# Patient Record
Sex: Female | Born: 2000 | Race: White | Hispanic: No | Marital: Single | Smoking: Never smoker
Health system: Southern US, Community
[De-identification: ages and names within clinical notes are randomized; demographics above are authoritative.]

## PROBLEM LIST (undated history)

## (undated) DIAGNOSIS — N390 Urinary tract infection, site not specified: Secondary | ICD-10-CM

## (undated) HISTORY — DX: Urinary tract infection, site not specified: N39.0

---
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
## 2021-01-14 ENCOUNTER — Other Ambulatory Visit (HOSPITAL_COMMUNITY): Payer: Self-pay | Admitting: Psychiatry

## 2021-01-22 ENCOUNTER — Other Ambulatory Visit (HOSPITAL_COMMUNITY): Payer: Self-pay | Admitting: Psychiatry

## 2021-01-27 ENCOUNTER — Other Ambulatory Visit (HOSPITAL_COMMUNITY): Payer: Self-pay | Admitting: Psychiatry

## 2021-01-28 ENCOUNTER — Telehealth (INDEPENDENT_AMBULATORY_CARE_PROVIDER_SITE_OTHER): Payer: BC Managed Care – PPO | Admitting: Psychiatry

## 2021-01-28 ENCOUNTER — Encounter (HOSPITAL_COMMUNITY): Payer: Self-pay | Admitting: Psychiatry

## 2021-01-28 DIAGNOSIS — F5102 Adjustment insomnia: Secondary | ICD-10-CM | POA: Diagnosis not present

## 2021-01-28 DIAGNOSIS — F410 Panic disorder [episodic paroxysmal anxiety] without agoraphobia: Secondary | ICD-10-CM

## 2021-01-28 DIAGNOSIS — F330 Major depressive disorder, recurrent, mild: Secondary | ICD-10-CM | POA: Diagnosis not present

## 2021-01-28 MED ORDER — SERTRALINE HCL 50 MG PO TABS: 75.0000 mg | ORAL_TABLET | Freq: Every day | ORAL | 1 refills | Status: DC

## 2021-01-28 MED ORDER — HYDROXYZINE PAMOATE 25 MG PO CAPS: 25.0000 mg | ORAL_CAPSULE | Freq: Every evening | ORAL | 1 refills | Status: DC | PRN
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
## 2021-02-14 ENCOUNTER — Telehealth (HOSPITAL_COMMUNITY): Payer: BC Managed Care – PPO | Admitting: Psychiatry

## 2021-02-28 ENCOUNTER — Telehealth (HOSPITAL_COMMUNITY): Payer: Self-pay | Admitting: Psychiatry

## 2021-02-28 ENCOUNTER — Other Ambulatory Visit: Payer: Self-pay | Admitting: Psychiatry

## 2021-02-28 MED ORDER — SERTRALINE HCL 50 MG PO TABS: 75.0000 mg | ORAL_TABLET | Freq: Every day | ORAL | 0 refills | Status: DC

## 2021-02-28 MED ORDER — TRAZODONE HCL 50 MG PO TABS: ORAL_TABLET | ORAL | 0 refills | Status: DC

## 2021-02-28 NOTE — Telephone Encounter (Signed)
Ordered sertraline and trazodone.

## 2021-02-28 NOTE — Telephone Encounter (Signed)
Pt called  -She is out of   sertraline (ZOLOFT) 50 MG tablet  - wants a 90 day supply - was told by the pharmacy that she needs new prescription to get 90 day  Pt also stated that hydrOXYzine (VISTARIL) 25 MG capsule - not working and wants to switch back to traZODone (DESYREL) 50 MG tablet   - I know this may have to wait until Dr. Gilmore Laroche is back in office but I wanted to relay the message

## 2021-03-29 ENCOUNTER — Telehealth (HOSPITAL_COMMUNITY): Payer: BC Managed Care – PPO | Admitting: Psychiatry

## 2021-04-05 ENCOUNTER — Encounter (HOSPITAL_COMMUNITY): Payer: Self-pay | Admitting: Psychiatry

## 2021-04-05 ENCOUNTER — Telehealth (INDEPENDENT_AMBULATORY_CARE_PROVIDER_SITE_OTHER): Payer: Self-pay | Admitting: Psychiatry

## 2021-04-05 DIAGNOSIS — F5102 Adjustment insomnia: Secondary | ICD-10-CM

## 2021-04-05 DIAGNOSIS — F411 Generalized anxiety disorder: Secondary | ICD-10-CM

## 2021-04-05 DIAGNOSIS — F410 Panic disorder [episodic paroxysmal anxiety] without agoraphobia: Secondary | ICD-10-CM

## 2021-04-05 DIAGNOSIS — F330 Major depressive disorder, recurrent, mild: Secondary | ICD-10-CM

## 2021-04-05 MED ORDER — TRAZODONE HCL 50 MG PO TABS: ORAL_TABLET | ORAL | 0 refills | Status: DC

## 2021-04-05 MED ORDER — SERTRALINE HCL 50 MG PO TABS: 75.0000 mg | ORAL_TABLET | Freq: Every day | ORAL | 0 refills | Status: DC
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
## 2021-05-02 ENCOUNTER — Other Ambulatory Visit (HOSPITAL_COMMUNITY): Payer: Self-pay

## 2021-05-02 MED ORDER — TRAZODONE HCL 50 MG PO TABS: ORAL_TABLET | ORAL | 0 refills | Status: DC

## 2021-06-10 ENCOUNTER — Telehealth (INDEPENDENT_AMBULATORY_CARE_PROVIDER_SITE_OTHER): Payer: Self-pay | Admitting: Psychiatry

## 2021-06-10 ENCOUNTER — Encounter (HOSPITAL_COMMUNITY): Payer: Self-pay | Admitting: Psychiatry

## 2021-06-10 DIAGNOSIS — F330 Major depressive disorder, recurrent, mild: Secondary | ICD-10-CM

## 2021-06-10 DIAGNOSIS — F5102 Adjustment insomnia: Secondary | ICD-10-CM

## 2021-06-10 DIAGNOSIS — F410 Panic disorder [episodic paroxysmal anxiety] without agoraphobia: Secondary | ICD-10-CM

## 2021-06-10 MED ORDER — SERTRALINE HCL 100 MG PO TABS: 100.0000 mg | ORAL_TABLET | Freq: Every day | ORAL | 0 refills | Status: DC

## 2021-06-10 MED ORDER — TRAZODONE HCL 50 MG PO TABS: ORAL_TABLET | ORAL | 0 refills | Status: DC
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
## 2021-07-23 ENCOUNTER — Telehealth (HOSPITAL_COMMUNITY): Payer: Self-pay | Admitting: Psychiatry

## 2021-07-29 ENCOUNTER — Other Ambulatory Visit (HOSPITAL_COMMUNITY): Payer: Self-pay | Admitting: Psychiatry

## 2021-07-29 ENCOUNTER — Telehealth (HOSPITAL_COMMUNITY): Payer: Self-pay

## 2021-07-29 MED ORDER — TRAZODONE HCL 50 MG PO TABS: ORAL_TABLET | ORAL | 0 refills | Status: DC

## 2021-07-29 MED ORDER — SERTRALINE HCL 100 MG PO TABS: 100.0000 mg | ORAL_TABLET | Freq: Every day | ORAL | 0 refills | Status: DC

## 2021-07-29 NOTE — Telephone Encounter (Signed)
New message    1. Which medications need to be refilled? (please list name of each medication and dose if known) sertraline (ZOLOFT) 100 MG tablet  2. Which pharmacy/location (including street and city if local pharmacy) is medication to be sent to?[facility] [phone] - [place], [address] AT [place]  3. Do they need a 30 day or 90 day supply? 90 days supply      1. Which medications need to be refilled? (please list name of each medication and dose if known) traZODone (DESYREL) 50 MG tablet  2. Which pharmacy/location (including street and city if local pharmacy) is medication to be sent to?[facility] [phone] - [place], [address] AT [place]  3. Do they need a 30 day or 90 day supply? 90 day supply

## 2021-07-29 NOTE — Telephone Encounter (Signed)
I sent in a 30 day supply of each. Patient needs an appt with Dr. Gilmore Laroche.

## 2021-07-30 NOTE — Telephone Encounter (Signed)
Called pt.  Left message to call our office to make an appointment.   Informed rx was sent.  Nothing Further Needed at this time.

## 2021-09-02 ENCOUNTER — Telehealth (HOSPITAL_COMMUNITY): Payer: Self-pay | Admitting: Psychiatry

## 2021-09-02 NOTE — Telephone Encounter (Signed)
Pt is needing a letter for her service dog so she can take it on campus with her.  Once complete we can email the letter to her.  Next apt 7/26 Last visits 5/30

## 2021-09-07 ENCOUNTER — Other Ambulatory Visit (HOSPITAL_COMMUNITY): Payer: Self-pay | Admitting: Psychiatry

## 2021-09-08 ENCOUNTER — Other Ambulatory Visit (HOSPITAL_COMMUNITY): Payer: Self-pay | Admitting: Psychiatry

## 2021-09-18 ENCOUNTER — Telehealth (INDEPENDENT_AMBULATORY_CARE_PROVIDER_SITE_OTHER): Payer: Self-pay | Admitting: Psychiatry

## 2021-09-18 ENCOUNTER — Encounter (HOSPITAL_COMMUNITY): Payer: Self-pay | Admitting: Psychiatry

## 2021-09-18 DIAGNOSIS — F410 Panic disorder [episodic paroxysmal anxiety] without agoraphobia: Secondary | ICD-10-CM

## 2021-09-18 DIAGNOSIS — F5102 Adjustment insomnia: Secondary | ICD-10-CM

## 2021-09-18 DIAGNOSIS — F330 Major depressive disorder, recurrent, mild: Secondary | ICD-10-CM
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
## 2021-09-21 IMAGING — DX DG FEMUR 2+V*R*
5 series · 5 of 5 positions shown · non-contrast
Comparison: None.

CLINICAL DATA: Pain

EXAM:
RIGHT FEMUR 2 VIEWS

[femur ap (1 of 3)]
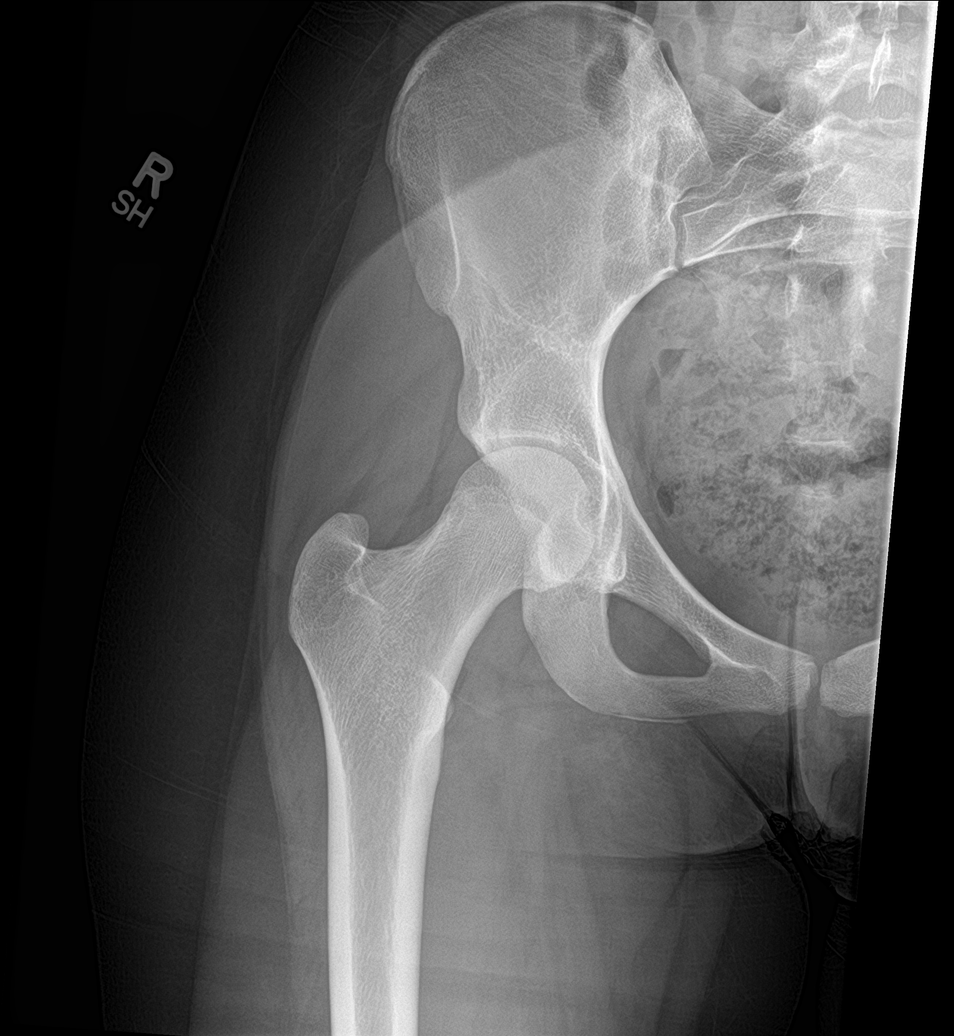

[femur ap (2 of 3)]
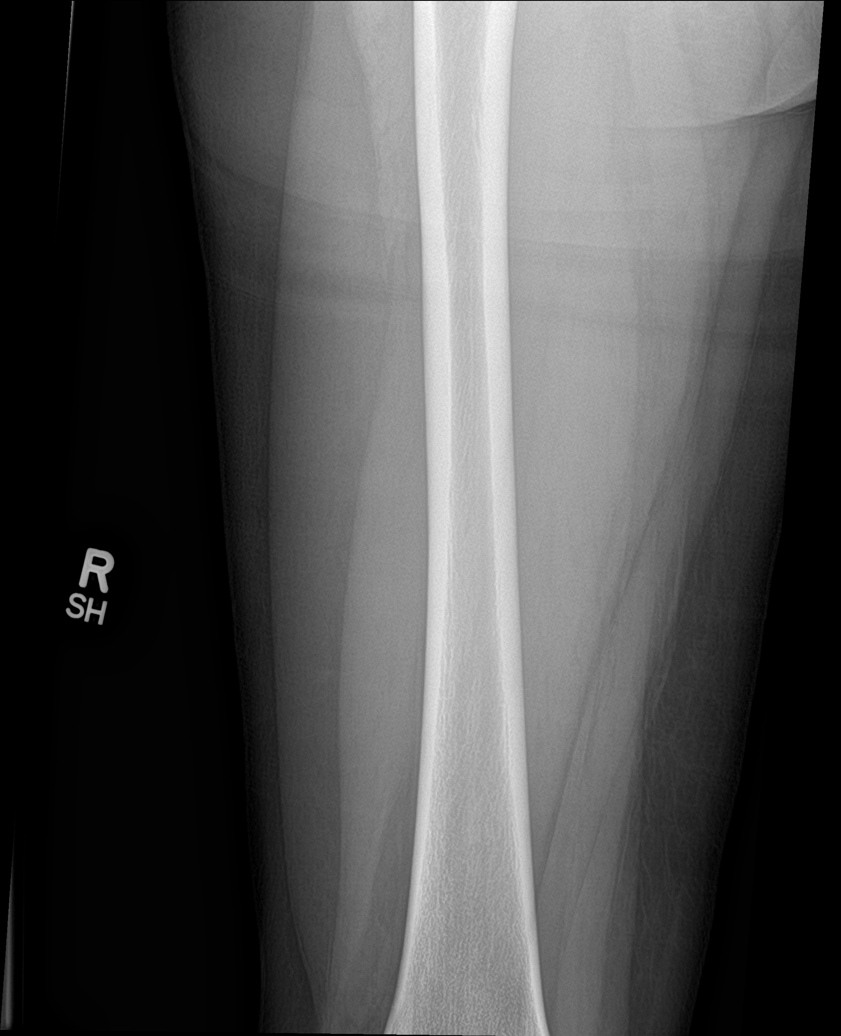

[femur lat (1 of 2)]
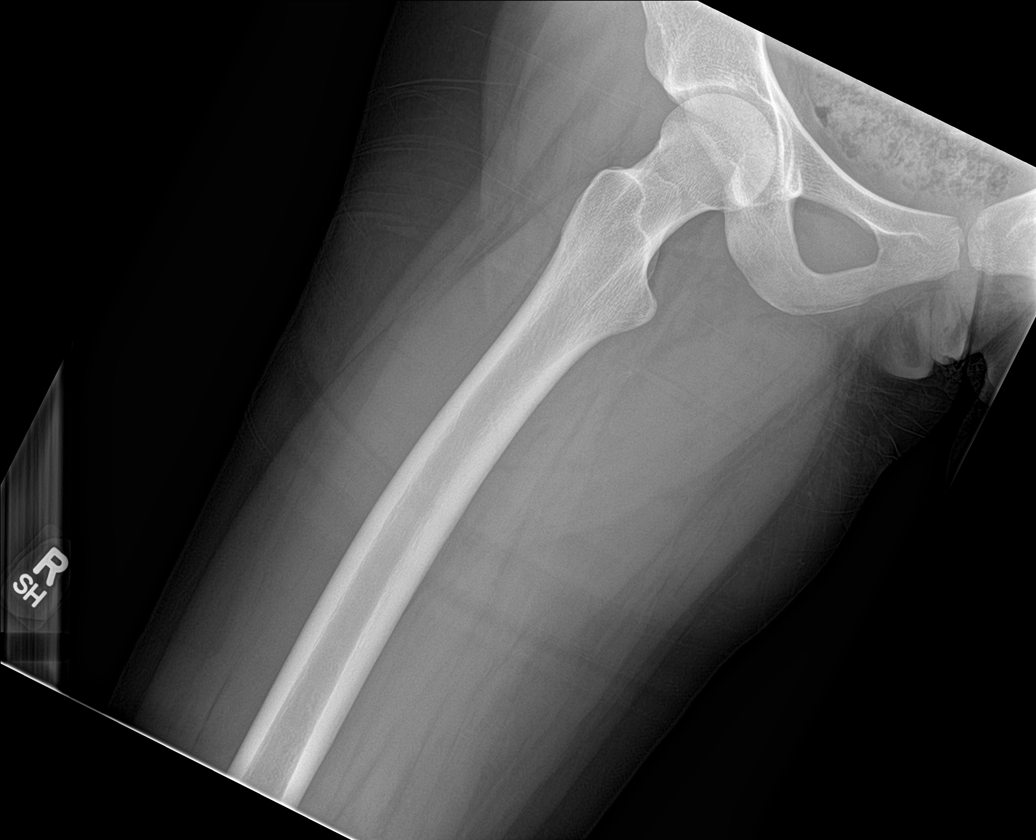

[femur ap (3 of 3)]
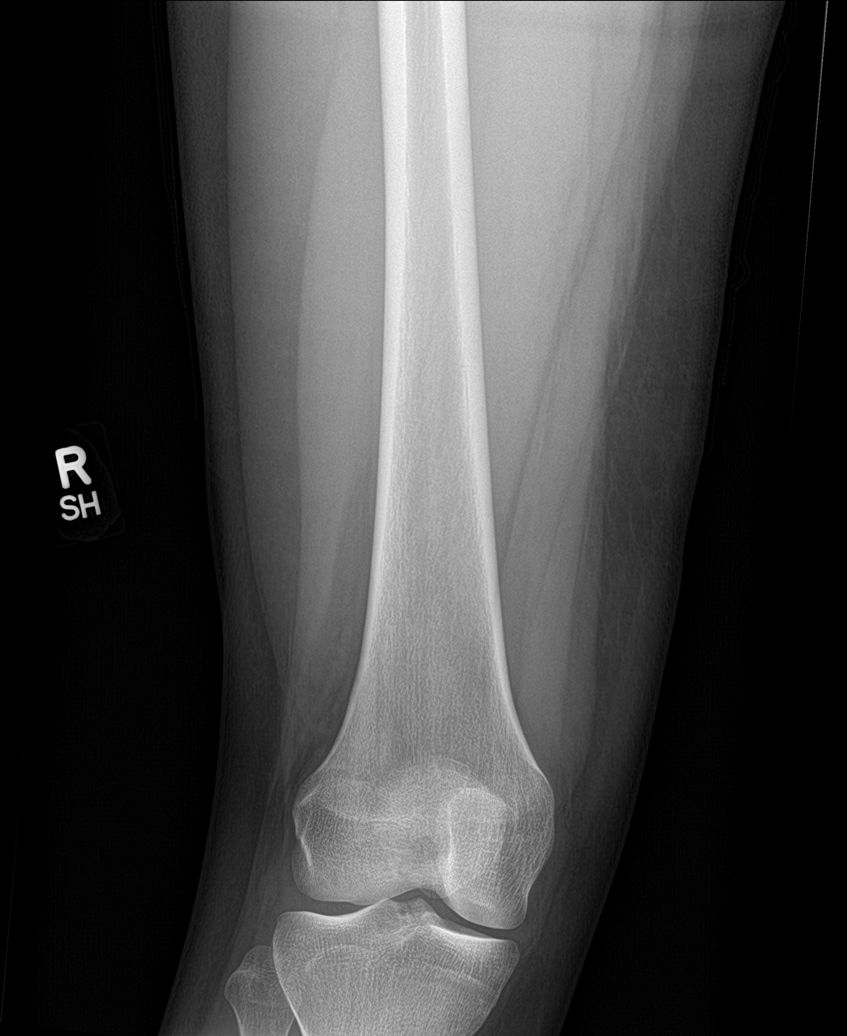

[femur lat (2 of 2)]
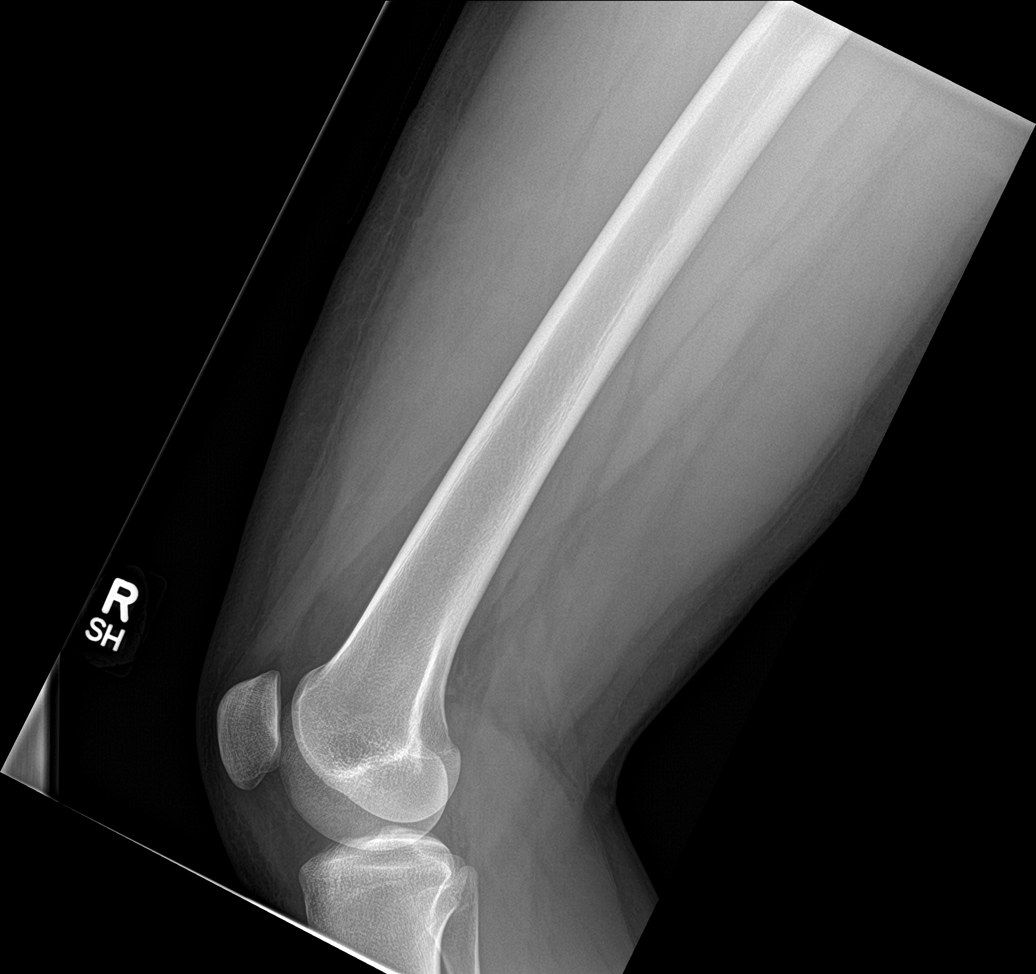

[5 of 5 positions shown; findings below may reference images not displayed]

FINDINGS: There is no acute displaced fracture or dislocation. Osseous
mineralization is within normal limits.
IMPRESSION: Negative.

See separate right hip dictation for further details of the right
hip.

## 2021-12-23 ENCOUNTER — Encounter (HOSPITAL_COMMUNITY): Payer: Self-pay

## 2021-12-23 ENCOUNTER — Telehealth (HOSPITAL_COMMUNITY): Admitting: Psychiatry

## 2022-06-09 ENCOUNTER — Encounter: Payer: Self-pay | Admitting: *Deleted

## 2023-03-05 DIAGNOSIS — N3020 Other chronic cystitis without hematuria: Secondary | ICD-10-CM | POA: Diagnosis not present

## 2023-04-09 ENCOUNTER — Encounter: Payer: Self-pay | Admitting: Radiology

## 2023-05-08 ENCOUNTER — Encounter: Payer: Self-pay | Admitting: Radiology

## 2023-05-28 ENCOUNTER — Other Ambulatory Visit (HOSPITAL_COMMUNITY)
Admission: RE | Admit: 2023-05-28 | Discharge: 2023-05-28 | Disposition: A | Discharge status: Home / Self Care | Source: Ambulatory Visit | Attending: Radiology | Admitting: Radiology

## 2023-05-28 ENCOUNTER — Encounter: Payer: Self-pay | Admitting: Radiology

## 2023-05-28 ENCOUNTER — Ambulatory Visit (INDEPENDENT_AMBULATORY_CARE_PROVIDER_SITE_OTHER): Admitting: Radiology

## 2023-05-28 VITALS — BP 114/78 | HR 107 | Ht 66.25 in | Wt 145.2 lb

## 2023-05-28 DIAGNOSIS — Z01419 Encounter for gynecological examination (general) (routine) without abnormal findings: Secondary | ICD-10-CM

## 2023-05-28 DIAGNOSIS — Z113 Encounter for screening for infections with a predominantly sexual mode of transmission: Secondary | ICD-10-CM | POA: Diagnosis not present

## 2023-05-28 DIAGNOSIS — Z1331 Encounter for screening for depression: Secondary | ICD-10-CM | POA: Diagnosis not present

## 2023-05-28 DIAGNOSIS — Z30011 Encounter for initial prescription of contraceptive pills: Secondary | ICD-10-CM

## 2023-05-28 MED ORDER — JUNEL FE 24 1-20 MG-MCG(24) PO TABS
1.0000 | ORAL_TABLET | Freq: Every day | ORAL | 4 refills | Status: AC
Start: 2023-05-28 — End: ?

## 2023-05-28 NOTE — Progress Notes (Signed)
 Eileen Wells 12/08/2000 [id number]   History:  23 y.o. G0 presents for annual exam. Would like to restart OCPs, does not remember what she took in the past, just that it was low dose. No other gyn concerns.   Gynecologic History Patient's last menstrual period was 05/22/2023 (exact date). Period Cycle (Days): 28 Period Duration (Days): 6 Period Pattern: Regular Menstrual Flow: Heavy Menstrual Control: Tampon (menstrual cup) Dysmenorrhea: (!) Severe Dysmenorrhea Symptoms: Cramping Contraception/Family planning: condoms Sexually active: yes   Obstetric History OB History  Gravida Para Term Preterm AB Living  0 0 0 0 0 0  SAB IAB Ectopic Multiple Live Births  0 0 0 0 0       05/28/2023    2:44 PM 07/16/2020    2:30 PM  Depression screen PHQ 2/9  Decreased Interest 0   Down, Depressed, Hopeless 0   PHQ - 2 Score 0   Altered sleeping    Tired, decreased energy    Change in appetite    Feeling bad or failure about yourself     Trouble concentrating    Moving slowly or fidgety/restless    Suicidal thoughts    PHQ-9 Score    Difficult doing work/chores       Information is confidential and restricted. Go to Review Flowsheets to unlock data.     The following portions of the patient's history were reviewed and updated as appropriate: allergies, current medications, past family history, past medical history, past social history, past surgical history, and problem list.  Review of Systems  All other systems reviewed and are negative.   Past medical history, past surgical history, family history and social history were all reviewed and documented in the EPIC chart.  Exam:  Vitals:   05/28/23 1442  BP: 114/78  Pulse: (!) 107  SpO2: 99%  Weight: 145 lb 3.2 oz (65.9 kg)  Height: 5' 6.25" (1.683 m)   Body mass index is 23.26 kg/m.  Physical Exam Vitals and nursing note reviewed. Exam conducted with a chaperone present.  Constitutional:      Appearance: Normal  appearance. She is normal weight.  HENT:     Head: Normocephalic and atraumatic.  Neck:     Thyroid: No thyroid mass, thyromegaly or thyroid tenderness.  Cardiovascular:     Rate and Rhythm: Regular rhythm.     Heart sounds: Normal heart sounds.  Pulmonary:     Effort: Pulmonary effort is normal.     Breath sounds: Normal breath sounds.  Chest:  Breasts:    Breasts are symmetrical.     Right: Normal. No inverted nipple, mass, nipple discharge, skin change or tenderness.     Left: Normal. No inverted nipple, mass, nipple discharge, skin change or tenderness.  Abdominal:     General: Abdomen is flat. Bowel sounds are normal.     Palpations: Abdomen is soft.  Genitourinary:    General: Normal vulva.     Vagina: Normal. No vaginal discharge, bleeding or lesions.     Cervix: Normal. No discharge or lesion.     Uterus: Normal. Not enlarged and not tender.      Adnexa: Right adnexa normal and left adnexa normal.       Right: No mass, tenderness or fullness.         Left: No mass, tenderness or fullness.    Lymphadenopathy:     Upper Body:     Right upper body: No axillary adenopathy.     Left upper  body: No axillary adenopathy.  Skin:    General: Skin is warm and dry.  Neurological:     Mental Status: She is alert and oriented to person, place, and time.  Psychiatric:        Mood and Affect: Mood normal.        Thought Content: Thought content normal.        Judgment: Judgment normal.      Raynelle Fanning, CMA present for exam  Assessment/Plan:   1. Well woman exam with routine gynecological exam (Primary) - Cytology - PAP( [place])  2. Screening for STDs (sexually transmitted diseases) - Cytology - PAP( [place])  3. Oral contraception initiation - Norethindrone Acetate-Ethinyl Estrad-FE (JUNEL FE 24) 1-20 MG-MCG(24) tablet; Take 1 tablet by mouth daily.  Dispense: 84 tablet; Refill: 4    Discussed SBE, pap and STI screening as directed/appropriate. Recommend  of exercise weekly, including weight bearing exercise.   Return in about 1 year (around 05/27/2024) for Annual.  Arlie Solomons B WHNP-BC 3:01 PM 05/28/2023

## 2023-05-28 NOTE — Patient Instructions (Signed)

## 2023-06-04 LAB — CYTOLOGY - PAP
Chlamydia: NEGATIVE
Comment: NEGATIVE
Comment: NEGATIVE
Comment: NEGATIVE
Comment: NEGATIVE
Comment: NEGATIVE
Comment: NORMAL
Diagnosis: UNDETERMINED — AB
HPV 16: NEGATIVE
HPV 18 / 45: NEGATIVE
High risk HPV: POSITIVE — AB
Neisseria Gonorrhea: NEGATIVE
Trichomonas: NEGATIVE

## 2023-06-08 DIAGNOSIS — Z87440 Personal history of urinary (tract) infections: Secondary | ICD-10-CM | POA: Diagnosis not present

## 2023-06-14 DIAGNOSIS — T1590XA Foreign body on external eye, part unspecified, unspecified eye, initial encounter: Secondary | ICD-10-CM | POA: Diagnosis not present

## 2023-06-14 DIAGNOSIS — H5711 Ocular pain, right eye: Secondary | ICD-10-CM | POA: Diagnosis not present

## 2023-06-14 DIAGNOSIS — Z880 Allergy status to penicillin: Secondary | ICD-10-CM | POA: Diagnosis not present

## 2023-06-14 DIAGNOSIS — S0501XA Injury of conjunctiva and corneal abrasion without foreign body, right eye, initial encounter: Secondary | ICD-10-CM | POA: Diagnosis not present

## 2023-06-14 DIAGNOSIS — X58XXXA Exposure to other specified factors, initial encounter: Secondary | ICD-10-CM | POA: Diagnosis not present

## 2023-08-17 ENCOUNTER — Ambulatory Visit: Payer: Self-pay

## 2023-08-17 NOTE — Telephone Encounter (Signed)
 FYI Only or Action Required?: FYI only for provider.  Patient was last seen in primary care on New Patient. Called Nurse Triage reporting Chest Pain. Symptoms began 2 1/2 month ago. Interventions attempted: Rest, hydration, or home remedies. Symptoms are: gradually worsening.  Triage Disposition: See Physician Within 24 Hours  Patient/caregiver understands and will follow disposition?: Yes  Copied from CRM [id number]. Topic: Clinical - Red Word Triage >> Aug 17, 2023  3:19 PM Fredrica W wrote: Red Word that prompted transfer to Nurse Triage: Chest pains, trouble breathing resting heart rate 140    ----------------------------------------------------------------------- From previous Reason for Contact - Scheduling: Patient/patient representative is calling to schedule an appointment. Refer to attachments for appointment information. Reason for Disposition  [1] Chest pain lasts > 5 minutes AND [2] occurred > 3 days ago (72 hours) AND [3] NO chest pain or cardiac symptoms now  Answer Assessment - Initial Assessment Questions Pt reports having on and off chest pain occur that comes and goes over the past 2 1/2 months, and reports stopping the vape once chest pain developed. Pt reports pain will come and go away, but last night pt reports pain lasting for about 7 hours, 3/10. Pt reports now has no chest pain, but some tightness, HR feels a little fast but not beating as fast as other day, no nausea right now.  Pt was advised to seek UC or ED. Pt stated she already contacted her closest UC and they told her to go to the ED, but pt does not want to go to this ED by her right now because it is rated 1 star. Once she is able to leave from watching her cousin within the next hour, she is going to drive home and wants to go to that ED by her home.  Nurse explains understanding, and advises that if s/s worsen again to seek this closer ED to be evaluated, to pull over if s/s worsen and call for EMS to get her  transported rest of the way. Pt verbalizes understanding.   Pt is a New Patient and would like to establish care with [place] per specialist report.   1. LOCATION: Where does it hurt?       chest 2. RADIATION: Does the pain go anywhere else? (e.g., into neck, jaw, arms, back)     Not really 3. ONSET: When did the chest pain begin? (Minutes, hours or days)      Past 2 1/2 months will come and go.   4. PATTERN: Does the pain come and go, or has it been constant since it started?  Does it get worse with exertion?      Come and go, less than 20 min.  5. DURATION: How long does it last (e.g., seconds, minutes, hours)     Few minutes, less than 20 min 6. SEVERITY: How bad is the pain?  (e.g., Scale 1-10; mild, moderate, or severe)    - MILD (1-3): doesn't interfere with normal activities     - MODERATE (4-7): interferes with normal activities or awakens from sleep    - SEVERE (8-10): excruciating pain, unable to do any normal activities       3/10 7. CARDIAC RISK FACTORS: Do you have any history of heart problems or risk factors for heart disease? (e.g., angina, prior heart attack; diabetes, high blood pressure, high cholesterol, smoker, or strong family history of heart disease)     no  8. PULMONARY RISK FACTORS: Do you have any history  of lung disease?  (e.g., blood clots in lung, asthma, emphysema, birth control pills)     no 9. CAUSE: What do you think is causing the chest pain?     Stopped vaping once pt noticed chest pain develop.  10. OTHER SYMPTOMS: Do you have any other symptoms? (e.g., dizziness, nausea, vomiting, sweating, fever, difficulty breathing, cough)       S/S: angina, SOB, Last night had: fever of 102, pain lasted for 7 hours. Pt reports pain of 3/10, heart palpitations, nausea.  11. PREGNANCY: Is there any chance you are pregnant? When was your last menstrual period?       no  Protocols used: Chest Pain-A-AH

## 2023-08-20 ENCOUNTER — Ambulatory Visit: Admitting: Family Medicine

## 2023-09-02 ENCOUNTER — Ambulatory Visit (INDEPENDENT_AMBULATORY_CARE_PROVIDER_SITE_OTHER): Admitting: Nurse Practitioner

## 2023-09-02 ENCOUNTER — Encounter: Payer: Self-pay | Admitting: Nurse Practitioner

## 2023-09-02 VITALS — BP 110/80 | HR 98 | Temp 98.4°F

## 2023-09-02 DIAGNOSIS — R300 Dysuria: Secondary | ICD-10-CM | POA: Diagnosis not present

## 2023-09-02 DIAGNOSIS — R350 Frequency of micturition: Secondary | ICD-10-CM | POA: Diagnosis not present

## 2023-09-02 DIAGNOSIS — Z113 Encounter for screening for infections with a predominantly sexual mode of transmission: Secondary | ICD-10-CM | POA: Diagnosis not present

## 2023-09-02 LAB — WET PREP FOR TRICH, YEAST, CLUE

## 2023-09-02 NOTE — Progress Notes (Signed)
   Acute Office Visit  Subjective:    Patient ID: Eileen Wells, female    DOB: [birth year], 23 y.o.   MRN: [id number]   HPI 23 y.o. presents today for urinary frequency, odor and burning with urination x 4 days. Negative STD screening in April. Would like screening today. Spotting today from menses.   Patient's last menstrual period was 08/26/2023 (approximate). Period Cycle (Days): 28 Period Duration (Days): 7 Period Pattern: Regular Menstrual Flow: Heavy Menstrual Control: Tampon, Other (Comment) (menstrual cup) Dysmenorrhea: (!) Severe Dysmenorrhea Symptoms: Cramping, Nausea, Headache  Review of Systems  Constitutional: Negative.   Genitourinary:  Positive for dysuria and frequency. Negative for genital sores, hematuria, pelvic pain, urgency, vaginal discharge and vaginal pain.       Odor       Objective:    Physical Exam Exam conducted with a chaperone present.  Constitutional:      Appearance: Normal appearance.  Genitourinary:    General: Normal vulva.     Vagina: Normal.     Cervix: Normal.     BP 110/80 (BP Location: Right Arm)   Pulse 98   Temp 98.4 F (36.9 C)   LMP 08/26/2023 (Approximate)   SpO2 97%  Wt Readings from Last 3 Encounters:  05/28/23 145 lb 3.2 oz (65.9 kg)  11/24/19 135 lb (61.2 kg) (69%, Z= 0.50)*  10/11/19 139 lb 14.4 oz (63.5 kg) (76%, Z= 0.70)*   * Growth percentiles are based on CDC (Girls, 2-20 Years) data.        Eileen Wells, CMA present as chaperone.   Wet prep negative for pathogens  UA: trace leukocytes, neg nitrites, neg blood, light yellow/slightly cloudy. Microscopic: wbc 6-10, rbc none, few bacteria  Assessment & Plan:   Problem List Items Addressed This Visit   None Visit Diagnoses       Urinary frequency    -  Primary   Relevant Orders   Urinalysis,Complete w/RFL Culture     Burning with urination       Relevant Orders   Urinalysis,Complete w/RFL Culture   WET PREP FOR TRICH, YEAST, CLUE     Screening  examination for STD (sexually transmitted disease)       Relevant Orders   RPR   HIV Antibody (routine testing w rflx)   C. trachomatis/N. gonorrhoeae RNA      Plan: Wet prep negative, normal exam. UA unremarkable, reflex culture pending. STD panel pending. If results are negative and symptoms remain she will try OTC hydrocortisone.   Return if symptoms worsen or fail to improve.    Eileen DELENA Shutter DNP, 3:42 PM 09/02/2023

## 2023-09-03 LAB — URINE CULTURE
MICRO NUMBER:: 16676601
SPECIMEN QUALITY:: ADEQUATE

## 2023-09-03 LAB — URINALYSIS, COMPLETE W/RFL CULTURE
Bilirubin Urine: NEGATIVE
Glucose, UA: NEGATIVE
Hgb urine dipstick: NEGATIVE
Hyaline Cast: NONE SEEN /LPF
Ketones, ur: NEGATIVE
Nitrites, Initial: NEGATIVE
Protein, ur: NEGATIVE
RBC / HPF: NONE SEEN /HPF (ref 0–2)
Specific Gravity, Urine: 1.002 (ref 1.001–1.035)
pH: 6 (ref 5.0–8.0)

## 2023-09-03 LAB — RPR: RPR Ser Ql: NONREACTIVE

## 2023-09-03 LAB — CULTURE INDICATED

## 2023-09-03 LAB — HIV ANTIBODY (ROUTINE TESTING W REFLEX): HIV 1&2 Ab, 4th Generation: NONREACTIVE

## 2023-09-03 LAB — C. TRACHOMATIS/N. GONORRHOEAE RNA
C. trachomatis RNA, TMA: NOT DETECTED
N. gonorrhoeae RNA, TMA: NOT DETECTED

## 2023-09-04 ENCOUNTER — Ambulatory Visit: Payer: Self-pay | Admitting: Nurse Practitioner

## 2023-09-08 DIAGNOSIS — R079 Chest pain, unspecified: Secondary | ICD-10-CM | POA: Diagnosis not present

## 2023-09-08 DIAGNOSIS — Z1330 Encounter for screening examination for mental health and behavioral disorders, unspecified: Secondary | ICD-10-CM | POA: Diagnosis not present

## 2023-09-08 DIAGNOSIS — R002 Palpitations: Secondary | ICD-10-CM | POA: Diagnosis not present

## 2023-09-08 DIAGNOSIS — N390 Urinary tract infection, site not specified: Secondary | ICD-10-CM | POA: Diagnosis not present

## 2023-09-19 DIAGNOSIS — L219 Seborrheic dermatitis, unspecified: Secondary | ICD-10-CM | POA: Diagnosis not present

## 2024-01-07 NOTE — Progress Notes (Deleted)
 Cardiology Office Note:    Date:  01/07/2024   ID:  Eileen Wells, DOB 11-07-2000, MRN [id number]  PCP:  Patient, No Pcp Per   [facility] Providers Cardiologist:  None { Click to update primary MD,subspecialty MD or APP then REFRESH:1}    Referring MD: No ref. provider found   No chief complaint on file. ***  History of Present Illness:    Eileen Wells is a 23 y.o. female is self referred for evaluation of palpitations. Was previously seen by Dr Uvaldo at [facility] cardiology. Echo was normal. Event monitor was benign with symptoms correlating with sinus tachycardia.   Past Medical History:  Diagnosis Date   Frequent UTI     No past surgical history on file.  Current Medications: No outpatient medications have been marked as taking for the 01/11/24 encounter (Appointment) with Steen Bisig M, MD.     Allergies:   Amoxicillin and Penicillins   Social History   Socioeconomic History   Marital status: Single    Spouse name: Not on file   Number of children: Not on file   Years of education: Not on file   Highest education level: Not on file  Occupational History   Not on file  Tobacco Use   Smoking status: Every Day    Types: E-cigarettes    Passive exposure: Never   Smokeless tobacco: Never   Tobacco comments:    Vapes, uses nicotine patches  Substance and Sexual Activity   Alcohol use: Yes    Comment: occasionally   Drug use: No   Sexual activity: Yes    Partners: Male    Birth control/protection: OCP    Comment: menarche 23yo, sexual debut 23yo  Other Topics Concern   Not on file  Social History Narrative   Not on file   Social Drivers of Health   Financial Resource Strain: Low Risk  (09/08/2023)   Received from [facility]   Overall Financial Resource Strain (CARDIA)    How hard is it for you to pay for the very basics like food, housing, medical care, and heating?: Not hard at all  Food Insecurity: No Food Insecurity (09/08/2023)    Received from [facility]   Hunger Vital Sign    Within the past 12 months, you worried that your food would run out before you got the money to buy more.: Never true    Within the past 12 months, the food you bought just didn't last and you didn't have money to get more. : Never true  Transportation Needs: No Transportation Needs (09/08/2023)   Received from [facility] - Transportation    In the past 12 months, has lack of transportation kept you from medical appointments or from getting medications?: No    In the past 12 months, has lack of transportation kept you from meetings, work, or from getting things needed for daily living?: No  Physical Activity: Not on file  Stress: No Stress Concern Present (11/15/2021)   Received from [facility]   [employer] - Occupational Stress Questionnaire    Feeling of Stress : Not at all  Social Connections: Not on file     Family History: The patient's ***family history includes Deep vein thrombosis in her paternal aunt; Heart defect in her paternal grandmother.  ROS:   Please see the history of present illness.    *** All other systems reviewed and are negative.  EKGs/Labs/Other  Studies Reviewed:    The following studies were reviewed today: Echo 10/27/23: Impression  Left Ventricle: Left ventricle size is normal. Systolic function is normal. EF: 55-60%.   Right Ventricle: Right ventricle size is normal. Systolic function is normal.  Event monitor 11/16/23: Impression  Start date 10/27/2023; recording length 14d 6h. The underlying rhythm was Sinus with a minimum heart rate of 48 bpm (at Day 5 / 09:46:53 am), maximum heart rate of 184 bpm (at Day 12 / 01:46:09 am), and an average heart rate of 86 bpm. PVC total: 9 (< 0.01 %). PVC Morphologies Total: 1. Ventricular Couplets Total: 0. No atrial flutter/fibrillation, no evidence of high-grade AV block or pathological  pauses. Patient-Triggered event(s) total: 18 -during sinus tachycardia.      Recent Labs: No results found for requested labs within last 365 days.  Recent Lipid Panel No results found for: CHOL, TRIG, HDL, CHOLHDL, VLDL, LDLCALC, LDLDIRECT   Risk Assessment/Calculations:   {Does this patient have ATRIAL FIBRILLATION?[phone]}  No BP recorded.  {Refresh Note OR Click here to enter BP  :1}***         Physical Exam:    VS:  There were no vitals taken for this visit.    Wt Readings from Last 3 Encounters:  05/28/23 145 lb 3.2 oz (65.9 kg)  11/24/19 135 lb (61.2 kg) (69%, Z= 0.50)*  10/11/19 139 lb 14.4 oz (63.5 kg) (76%, Z= 0.70)*   * Growth percentiles are based on CDC (Girls, 2-20 Years) data.     GEN: *** Well nourished, well developed in no acute distress HEENT: Normal NECK: No JVD; No carotid bruits LYMPHATICS: No lymphadenopathy CARDIAC: ***RRR, no murmurs, rubs, gallops RESPIRATORY:  Clear to auscultation without rales, wheezing or rhonchi  ABDOMEN: Soft, non-tender, non-distended MUSCULOSKELETAL:  No edema; No deformity  SKIN: Warm and dry NEUROLOGIC:  Alert and oriented x 3 PSYCHIATRIC:  Normal affect   ASSESSMENT:    No diagnosis found. PLAN:    In order of problems listed above:  ***      {Are you ordering a CV Procedure (e.g. stress test, cath, DCCV, TEE, etc)?   Press F2        :[id number]}    Medication Adjustments/Labs and Tests Ordered: Current medicines are reviewed at length with the patient today.  Concerns regarding medicines are outlined above.  No orders of the defined types were placed in this encounter.  No orders of the defined types were placed in this encounter.   There are no Patient Instructions on file for this visit.   Signed, Aalaysia Liggins, MD  01/07/2024 2:13 PM    [facility]

## 2024-01-11 ENCOUNTER — Ambulatory Visit: Attending: Cardiology | Admitting: Cardiology

## 2024-01-12 ENCOUNTER — Encounter: Payer: Self-pay | Admitting: Cardiology

## 2024-02-09 ENCOUNTER — Ambulatory Visit: Payer: Self-pay

## 2024-02-09 NOTE — Telephone Encounter (Signed)
 FYI Only or Action Required?: FYI only for provider: appointment scheduled on 02/12/24.  Patient was last seen in primary care on: new patient .  Called Nurse Triage reporting Chest Pain.  Symptoms began 1 year ago.  Interventions attempted: Other: holter monitor; EKG.  Symptoms are: unchanged.  Triage Disposition: See PCP When Office is Open (Within 3 Days)  Patient/caregiver understands and will follow disposition?: Yes   Copied from CRM #[id number]. Topic: Clinical - Red Word Triage >> Feb 09, 2024  4:06 PM Shanda MATSU wrote: Red Word that prompted transfer to Nurse Triage: Patient is reporting rapid heart rate and chest pain for about a year but getting worse.   Reason for Disposition  [1] Chest pain lasting < 5 minutes AND [2] has not taken prescribed nitroglycerin  Answer Assessment - Initial Assessment Questions Pt called in to establish care with PCP. Discussed she was seeing [facility] for rapid HR and chest pain for the past year but would like second opinion after wearing a holter monitor for 2 weeks. Pt reports testing showed tachycardia and bradycardia; POC discussed initiating betablocker but no further POC so pt stopped going as she felt like she did not receive answers. Pt later experienced hospitalization d/t HR in 180s, fever and h/a. Pt reports she went to ED and an EKG was performed but was WNL so she was told to establish care with a PCP. Pt reports monitoring her HR daily and there is no association to any certain activity with elevated HR. States she will be sitting and relaxing and get a notification that her HR is elevated. Pt recently d/c Hailey 24 Fe birth control pills and is not on any daily medications. Appointment scheduled for evaluation. Patient agrees with plan of care, and will call back if anything changes, or if symptoms worsen.    1. LOCATION: Where does it hurt?       Mid-sternum to L chest   2. RADIATION: Does the pain go anywhere else?  (e.g., into neck, jaw, arms, back)     No   3. ONSET: When did the chest pain begin? (Minutes, hours or days)      Recurrent for the past year; pt was seen by [facility]. Wore a holter monitor for 2 weeks: showed tachycardia and bradycardia--discussed tx of  betablocker but no further POC so pt did not return to practice.   4. PATTERN: Does the pain come and go, or has it been constant since it started?  Does it get worse with exertion?      Comes and goes; not worse with exertion.   5. DURATION: How long does it last (e.g., seconds, minutes, hours)     30seconds -   6. SEVERITY: How bad is the pain?  (e.g., Scale 1-10; mild, moderate, or severe)     Sharp pain at times  7. CARDIAC RISK FACTORS: Do you have any history of heart problems or risk factors for heart disease? (e.g., angina, prior heart attack; diabetes, high blood pressure, high cholesterol, smoker, or strong family history of heart disease)     Unknown; has had cardiac workup without significant results   8. PULMONARY RISK FACTORS: Do you have any history of lung disease?  (e.g., blood clots in lung, asthma, emphysema, birth control pills)     None   9. CAUSE: What do you think is causing the chest pain?     Unknown   10. OTHER SYMPTOMS: Do you have any other  symptoms? (e.g., dizziness, nausea, vomiting, sweating, fever, difficulty breathing, cough)       1 time occurrence of chest pain, elevated HR in 180s, h/a and fever; went to the ED and EKG was WNL so she was discharge. Pt now monitors HR on her own daily  Protocols used: Chest Pain-A-AH

## 2024-02-11 ENCOUNTER — Ambulatory Visit: Admitting: Sports Medicine

## 2024-02-12 ENCOUNTER — Encounter: Payer: Self-pay | Admitting: Sports Medicine

## 2024-02-12 ENCOUNTER — Ambulatory Visit: Admitting: Sports Medicine

## 2024-02-12 VITALS — BP 126/80 | HR 92 | Temp 98.6°F | Ht 67.0 in | Wt 144.8 lb

## 2024-02-12 DIAGNOSIS — E785 Hyperlipidemia, unspecified: Secondary | ICD-10-CM | POA: Diagnosis not present

## 2024-02-12 DIAGNOSIS — Z131 Encounter for screening for diabetes mellitus: Secondary | ICD-10-CM | POA: Diagnosis not present

## 2024-02-12 DIAGNOSIS — R000 Tachycardia, unspecified: Secondary | ICD-10-CM

## 2024-02-12 LAB — CBC WITH DIFFERENTIAL/PLATELET
Basophils Absolute: 0 K/uL (ref 0.0–0.1)
Basophils Relative: 0.3 % (ref 0.0–3.0)
Eosinophils Absolute: 0.3 K/uL (ref 0.0–0.7)
Eosinophils Relative: 4.5 % (ref 0.0–5.0)
HCT: 36.6 % (ref 36.0–46.0)
Hemoglobin: 12.7 g/dL (ref 12.0–15.0)
Lymphocytes Relative: 43.5 % (ref 12.0–46.0)
Lymphs Abs: 2.9 K/uL (ref 0.7–4.0)
MCHC: 34.6 g/dL (ref 30.0–36.0)
MCV: 90.3 fl (ref 78.0–100.0)
Monocytes Absolute: 0.4 K/uL (ref 0.1–1.0)
Monocytes Relative: 6 % (ref 3.0–12.0)
Neutro Abs: 3.1 K/uL (ref 1.4–7.7)
Neutrophils Relative %: 45.7 % (ref 43.0–77.0)
Platelets: 244 K/uL (ref 150.0–400.0)
RBC: 4.06 Mil/uL (ref 3.87–5.11)
RDW: 12.5 % (ref 11.5–15.5)
WBC: 6.7 K/uL (ref 4.0–10.5)

## 2024-02-12 LAB — POCT URINE PREGNANCY: Preg Test, Ur: NEGATIVE

## 2024-02-12 LAB — HEMOGLOBIN A1C: Hgb A1c MFr Bld: 5 % (ref 4.6–6.5)

## 2024-02-12 MED ORDER — METOPROLOL SUCCINATE ER 25 MG PO TB24: 25.0000 mg | ORAL_TABLET | Freq: Every day | ORAL | 3 refills | Status: AC

## 2024-02-12 NOTE — Progress Notes (Signed)
 "  New Patient Office Visit  Patient ID: Eileen Wells, Female   DOB: 12-23-2000 23 y.o. MRN: [id number] Subjective:     Discussed the use of AI scribe software for clinical note transcription with the patient, who gave verbal consent to proceed.  History of Present Illness  [name] is a 23 year old female who presents to establish care c/o  chest pain and rapid heart rate since 1 yr  She has been experiencing chest pain and a rapid heart rate for approximately one year, with symptoms worsening over the last two weeks. The chest pain occurs several times a day, lasting between 30 seconds to two minutes, while the rapid heart rate episodes last about four to five minutes. She describes the sensation as feeling her heart beating rapidly, sometimes accompanied by dizziness and lightheadedness.  During the summer, she experienced a particularly severe episode while out of town, where her heart rate reached 180 bpm, accompanied by lightheadedness, a fever of 102F, chills, and weakness. This episode led to an ER visit, where an EKG was performed. Subsequent evaluations included a two-week Holter monitor and an echocardiogram.  She saw cardiology and had holter and echo  Imaging Results - 30 Day Cardiac Event Monitor-Interpretation (11/16/2023 7:48 AM EDT) Impressions  11/16/2023 7:48 AM EDT  Start date 10/27/2023; recording length 14d 6h. The underlying rhythm was Sinus with a minimum heart rate of 48 bpm (at Day 5 / 09:46:53 am), maximum heart rate of 184 bpm (at Day 12 / 01:46:09 am), and an average heart rate of 86 bpm. PVC total: 9 (< 0.01 %). PVC Morphologies Total: 1. Ventricular Couplets Total: 0. No atrial flutter/fibrillation, no evidence of high-grade AV block or pathological pauses. Patient-Triggered event(s) total: 18 -during sinus tachycardia.    10/28/2023 8:49 AM EDT  Left Ventricle: Left ventricle size is normal. Systolic function is normal. EF: 55-60%.   Right  Ventricle: Right ventricle size is normal. Systolic function is normal.  ECG Results - ECG 12 lead (10/21/2023 3:03 PM EDT) Impressions  Badal, Marcello POUR, MD - 10/21/2023 3:03 PM EDT  Sinus Rhythm -With rate variation cv = 10. -RSR(V1) -probably normal for age. -Left atrial enlargement.      She stopped vaping earlier this year. She currently uses nicotine pouches twice a day, each containing three mg of nicotine.   She has a history of anxiety but does not feel anxious during these episodes. She drinks between four and six bottles of water daily. She has a past history of childhood asthma but has not used an inhaler since high school and has not experienced exercise-induced asthma recently.  Recent lab work from July indicated high cholesterol, but she is not on medication for it. She has been trying to maintain a healthy diet. She is transitioning off birth control due to irregular periods and has an upcoming appointment with her gynecologist.     Outpatient Encounter Medications as of 02/12/2024  Medication Sig   MELATONIN PO Take by mouth.   metoprolol succinate (TOPROL-XL) 25 MG 24 hr tablet Take 1 tablet (25 mg total) by mouth daily.   [DISCONTINUED] norethindrone-ethinyl estradiol-FE (HAILEY FE 1/20) 1-20 MG-MCG tablet Take by mouth.   Norethindrone Acetate-Ethinyl Estrad-FE (JUNEL  FE 24) 1-20 MG-MCG(24) tablet Take 1 tablet by mouth daily.   [DISCONTINUED] benzonatate  (TESSALON ) 200 MG capsule Take 1 capsule (200 mg total) by mouth every 8 (eight) hours. (Patient not taking: Reported on 09/02/2023)   [DISCONTINUED] fluticasone  (FLONASE ) 50 MCG/ACT nasal  spray Place 2 sprays into both nostrils daily. (Patient not taking: Reported on 09/02/2023)   [DISCONTINUED] lidocaine  (XYLOCAINE ) 2 % solution 5-15 mL gurgle as needed (Patient not taking: Reported on 09/02/2023)   [DISCONTINUED] nitrofurantoin  (MACRODANTIN ) 50 MG capsule Take 50 mg by mouth at bedtime. (Patient not taking: Reported on  09/02/2023)   [DISCONTINUED] sertraline  (ZOLOFT ) 100 MG tablet TAKE 1 TABLET(100 MG) BY MOUTH DAILY (Patient not taking: Reported on 09/02/2023)   [DISCONTINUED] traZODone  (DESYREL ) 50 MG tablet TAKE 1 TABLET(50 MG) BY MOUTH AT BEDTIME. START WITH HALF AT NIGHT FOR FIRST 3 NIGHTS AND THEN 1 AT NIGHT AS NEEDED (Patient not taking: Reported on 09/02/2023)   No facility-administered encounter medications on file as of 02/12/2024.    Past Medical History:  Diagnosis Date   Frequent UTI     History reviewed. No pertinent surgical history.  Family History  Problem Relation Age of Onset   Deep vein thrombosis Paternal Aunt    Heart defect Paternal Grandmother        hole in heart    Social History   Socioeconomic History   Marital status: Single    Spouse name: Not on file   Number of children: Not on file   Years of education: Not on file   Highest education level: Not on file  Occupational History   Not on file  Tobacco Use   Smoking status: Every Day    Types: E-cigarettes    Passive exposure: Never   Smokeless tobacco: Never   Tobacco comments:    Vapes, uses nicotine patches  Substance and Sexual Activity   Alcohol use: Yes    Comment: occasionally   Drug use: No   Sexual activity: Yes    Partners: Male    Birth control/protection: OCP    Comment: menarche 23yo, sexual debut 23yo  Other Topics Concern   Not on file  Social History Narrative   Not on file   Social Drivers of Health   Tobacco Use: High Risk (02/12/2024)   Patient History    Smoking Tobacco Use: Every Day    Smokeless Tobacco Use: Never    Passive Exposure: Never  Financial Resource Strain: Low Risk (09/08/2023)   Received from [facility]   Overall Financial Resource Strain (CARDIA)    How hard is it for you to pay for the very basics like food, housing, medical care, and heating?: Not hard at all  Food Insecurity: No Food Insecurity (09/08/2023)   Received from [facility]   Epic    Within  the past 12 months, you worried that your food would run out before you got the money to buy more.: Never true    Within the past 12 months, the food you bought just didn't last and you didn't have money to get more. : Never true  Transportation Needs: No Transportation Needs (09/08/2023)   Received from [facility]    In the past 12 months, has lack of transportation kept you from medical appointments or from getting medications?: No    In the past 12 months, has lack of transportation kept you from meetings, work, or from getting things needed for daily living?: No  Physical Activity: Not on file  Stress: No Stress Concern Present (11/15/2021)   Received from [facility]   [employer] of [employer] - Occupational Stress Questionnaire    Feeling of Stress : Not at all  Social Connections: Not on file  Intimate Partner Violence: Not At Risk (11/15/2021)   Received from [facility]   Epic    Within the last year, have you been afraid of your partner or ex-partner?: No    Within the last year, have you been humiliated or emotionally abused in other ways by your partner or ex-partner?: No    Within the last year, have you been kicked, hit, slapped, or otherwise physically hurt by your partner or ex-partner?: No    Within the last year, have you been raped or forced to have any kind of sexual activity by your partner or ex-partner?: No  Depression (PHQ2-9): Medium Risk (02/12/2024)   Depression (PHQ2-9)    PHQ-2 Score: 8  Alcohol Screen: Not on file  Housing: Low Risk (09/08/2023)   Received from [facility]    In the last 12 months, was there a time when you were not able to pay the mortgage or rent on time?: No    In the past 12 months, how many times have you moved where you were living?: 0    At any time in the past 12 months, were you homeless or living in a shelter (including now)?: No  Utilities: Not At Risk  (09/08/2023)   Received from [facility]    In the past 12 months has the electric, gas, oil, or water company threatened to shut off services in your home?: No  Health Literacy: Not on file    Review of Systems  Constitutional:  Negative for chills and fever.  HENT:  Negative for congestion and sore throat.   Eyes:  Negative for blurred vision.  Respiratory:  Negative for cough, sputum production and shortness of breath.   Cardiovascular:  Positive for chest pain and palpitations. Negative for leg swelling.  Gastrointestinal:  Negative for abdominal pain, heartburn and nausea.  Genitourinary:  Negative for dysuria, frequency and hematuria.  Musculoskeletal:  Negative for falls and myalgias.  Neurological:  Negative for dizziness, sensory change and focal weakness.     Objective:    BP 126/80   Pulse 92   Temp 98.6 F (37 C) (Oral)   Ht 5' 7 (1.702 m)   Wt 144 lb 12.8 oz (65.7 kg)   SpO2 100%   BMI 22.68 kg/m   Physical Exam Constitutional:      Appearance: Normal appearance.  HENT:     Head: Normocephalic and atraumatic.  Cardiovascular:     Rate and Rhythm: Regular rhythm.  Pulmonary:     Effort: Pulmonary effort is normal. No respiratory distress.     Breath sounds: Normal breath sounds. No wheezing.  Abdominal:     General: Bowel sounds are normal. There is no distension.     Tenderness: There is no abdominal tenderness. There is no guarding or rebound.     Comments:    Musculoskeletal:        General: No swelling or tenderness.  Skin:    General: Skin is dry.  Neurological:     Mental Status: She is alert. Mental status is at baseline.     Sensory: No sensory deficit.     Motor: No weakness.     Last CBC Lab Results  Component Value Date   WBC 6.8 12/13/2019   HGB 13.8 12/13/2019   HCT 40.2 12/13/2019   MCV 89 12/13/2019   MCH 30.7 12/13/2019   RDW 11.2 (L) 12/13/2019   PLT 365 12/13/2019  Last metabolic panel Lab Results  Component  Value Date   GLUCOSE 127 (H) 12/13/2019   NA 137 12/13/2019   K 4.2 12/13/2019   CL 102 12/13/2019   CO2 21 12/13/2019   BUN 8 12/13/2019   CREATININE 0.77 12/13/2019   GFRNONAA CANCELED 12/13/2019   CALCIUM 9.7 12/13/2019   PROT 7.5 12/13/2019   ALBUMIN 4.9 12/13/2019   LABGLOB 2.6 12/13/2019   AGRATIO 1.9 12/13/2019   BILITOT 0.3 12/13/2019   ALKPHOS 98 12/13/2019   AST 9 12/13/2019   ALT 9 12/13/2019   Last lipids No results found for: CHOL, HDL, LDLCALC, LDLDIRECT, TRIG, CHOLHDL Last hemoglobin A1c No results found for: HGBA1C Last thyroid functions No results found for: TSH, T3TOTAL, T4TOTAL, FREET4, THYROIDAB Last vitamin D No results found for: 25OHVITD2, 25OHVITD3, VD25OH Last vitamin B12 and Folate No results found for: VITAMINB12, FOLATE     Assessment & Plan:   Assessment & Plan Tachycardia EKG - no acute ST T wave changes Will start toprol  Will repeat labs Follow up in 4 weeks Orders:   EKG 12-Lead   CBC with Differential/Platelet; Future   COMPLETE METABOLIC PANEL WITHOUT GFR; Future   TSH; Future   metoprolol succinate (TOPROL-XL) 25 MG 24 hr tablet; Take 1 tablet (25 mg total) by mouth daily.   POCT urine pregnancy  Hyperlipidemia, unspecified hyperlipidemia type Will repeat lipid panel at next visit    Screening for diabetes mellitus  Orders:   Hemoglobin A1c; Future   Return in about 4 weeks (around 03/11/2024).   Jackalyn Blazing, MD [facility]    "

## 2024-02-12 NOTE — Addendum Note (Signed)
 Addended by: FLETA CARE D on: 02/12/2024 04:25 PM   Modules accepted: Orders

## 2024-02-12 NOTE — Patient Instructions (Signed)

## 2024-02-13 LAB — COMPLETE METABOLIC PANEL WITHOUT GFR
AG Ratio: 1.8 (calc) (ref 1.0–2.5)
ALT: 15 U/L (ref 6–29)
AST: 14 U/L (ref 10–30)
Albumin: 4.3 g/dL (ref 3.6–5.1)
Alkaline phosphatase (APISO): 64 U/L (ref 31–125)
BUN: 11 mg/dL (ref 7–25)
CO2: 25 mmol/L (ref 20–32)
Calcium: 8.9 mg/dL (ref 8.6–10.2)
Chloride: 105 mmol/L (ref 98–110)
Creat: 0.73 mg/dL (ref 0.50–0.96)
Globulin: 2.4 g/dL (ref 1.9–3.7)
Glucose, Bld: 83 mg/dL (ref 65–99)
Potassium: 3.8 mmol/L (ref 3.5–5.3)
Sodium: 138 mmol/L (ref 135–146)
Total Bilirubin: 0.3 mg/dL (ref 0.2–1.2)
Total Protein: 6.7 g/dL (ref 6.1–8.1)

## 2024-02-13 LAB — TSH: TSH: 3.93 m[IU]/L

## 2024-02-15 ENCOUNTER — Ambulatory Visit: Payer: Self-pay | Admitting: Sports Medicine

## 2024-03-14 ENCOUNTER — Ambulatory Visit: Admitting: Sports Medicine

## 2024-03-14 NOTE — Progress Notes (Unsigned)
 " Cardiology Office Note:    Date:  03/14/2024   ID:  Eileen Wells, DOB [birth year], MRN [id number]  PCP:  Eileen Madden, MD   [facility] Providers Cardiologist:  None { Click to update primary MD,subspecialty MD or APP then REFRESH:1}    Referring MD: No ref. provider found   No chief complaint on file. ***  History of Present Illness:    Eileen Wells is a 24 y.o. female seen at the request of Dr Wells for second opinion concerning symptoms of tachycardia. She has a history of familial HLD. She was previously seen by Dr Eileen Wells with [facility] cardiology for same complaints. Event monitor showed NSR with sinus tachycardia. Rare PVCs. Echo was normal. This past month she was started on low dose Toprol  XL.   Past Medical History:  Diagnosis Date   Frequent UTI     No past surgical history on file.  Current Medications: Active Medications[1]   Allergies:   Amoxicillin and Penicillins   Social History   Socioeconomic History   Marital status: Single    Spouse name: Not on file   Number of children: Not on file   Years of education: Not on file   Highest education level: Not on file  Occupational History   Not on file  Tobacco Use   Smoking status: Every Day    Types: E-cigarettes    Passive exposure: Never   Smokeless tobacco: Never   Tobacco comments:    Vapes, uses nicotine patches  Substance and Sexual Activity   Alcohol use: Yes    Comment: occasionally   Drug use: No   Sexual activity: Yes    Partners: Male    Birth control/protection: OCP    Comment: menarche 24yo, sexual debut 24yo  Other Topics Concern   Not on file  Social History Narrative   Not on file   Social Drivers of Health   Tobacco Use: High Risk (02/12/2024)   Patient History    Smoking Tobacco Use: Every Day    Smokeless Tobacco Use: Never    Passive Exposure: Never  Financial Resource Strain: Low Risk (09/08/2023)   Received from [facility]   Overall  Financial Resource Strain (CARDIA)    How hard is it for you to pay for the very basics like food, housing, medical care, and heating?: Not hard at all  Food Insecurity: No Food Insecurity (09/08/2023)   Received from [facility]   Epic    Within the past 12 months, you worried that your food would run out before you got the money to buy more.: Never true    Within the past 12 months, the food you bought just didn't last and you didn't have money to get more. : Never true  Transportation Needs: No Transportation Needs (09/08/2023)   Received from [facility]    In the past 12 months, has lack of transportation kept you from medical appointments or from getting medications?: No    In the past 12 months, has lack of transportation kept you from meetings, work, or from getting things needed for daily living?: No  Physical Activity: Not on file  Stress: No Stress Concern Present (11/15/2021)   Received from [facility]   [employer] - Occupational Stress Questionnaire    Feeling of Stress : Not at all  Social Connections: Not on file  Depression (PHQ2-9): Medium Risk (02/12/2024)   Depression (PHQ2-9)  PHQ-2 Score: 8  Alcohol Screen: Not on file  Housing: Low Risk (09/08/2023)   [facility] from [facility]    In the last 12 months, was there a time when you were not able to pay the mortgage or rent on time?: No    In the past 12 months, how many times have you moved where you were living?: 0    At any time in the past 12 months, were you homeless or living in a shelter (including now)?: No  Utilities: Not At Risk (09/08/2023)   Received from [facility]    In the past 12 months has the electric, gas, oil, or water company threatened to shut off services in your home?: No  Health Literacy: Not on file     Family History: The patient's ***family history includes Deep vein thrombosis in her paternal aunt; Heart  defect in her paternal grandmother.  ROS:   Please see the history of present illness.    *** All other systems reviewed and are negative.  EKGs/Labs/Other Studies Reviewed:    The following studies were reviewed today: Event monitor 11/16/23: Impression  Start date 10/27/2023; recording length 14d 6h. The underlying rhythm was Sinus with a minimum heart rate of 48 bpm (at Day 5 / 09:46:53 am), maximum heart rate of 184 bpm (at Day 12 / 01:46:09 am), and an average heart rate of 86 bpm. PVC total: 9 (< 0.01 %). PVC Morphologies Total: 1. Ventricular Couplets Total: 0. No atrial flutter/fibrillation, no evidence of high-grade AV block or pathological pauses. Patient-Triggered event(s) total: 18 -during sinus tachycardia. Narrative  Echo 10/27/23: Impression  Left Ventricle: Left ventricle size is normal. Systolic function is normal. EF: 55-60%.   Right Ventricle: Right ventricle size is normal. Systolic function is normal.      Recent Labs: 02/12/2024: ALT 15; BUN 11; Creat 0.73; Hemoglobin 12.7; Platelets 244.0; Potassium 3.8; Sodium 138; TSH 3.93  Recent Lipid Panel No results found for: CHOL, TRIG, HDL, CHOLHDL, VLDL, LDLCALC, LDLDIRECT   Risk Assessment/Calculations:   {Does this patient have ATRIAL FIBRILLATION?[phone]}  No BP recorded.  {Refresh Note OR Click here to enter BP  :1}***         Physical Exam:    VS:  There were no vitals taken for this visit.    Wt Readings from Last 3 Encounters:  02/12/24 144 lb 12.8 oz (65.7 kg)  05/28/23 145 lb 3.2 oz (65.9 kg)  11/24/19 135 lb (61.2 kg) (69%, Z= 0.50)*   * Growth percentiles are based on CDC (Girls, 2-20 Years) data.     GEN: *** Well nourished, well developed in no acute distress HEENT: Normal NECK: No JVD; No carotid bruits LYMPHATICS: No lymphadenopathy CARDIAC: ***RRR, no murmurs, rubs, gallops RESPIRATORY:  Clear to auscultation without rales, wheezing or rhonchi  ABDOMEN: Soft,  non-tender, non-distended MUSCULOSKELETAL:  No edema; No deformity  SKIN: Warm and dry NEUROLOGIC:  Alert and oriented x 3 PSYCHIATRIC:  Normal affect   ASSESSMENT:    No diagnosis found. PLAN:    In order of problems listed above:  ***      {Are you ordering a CV Procedure (e.g. stress test, cath, DCCV, TEE, etc)?   Press F2        :[id number]}    Medication Adjustments/Labs and Tests Ordered: Current medicines are reviewed at length with the patient today.  Concerns regarding medicines are outlined above.  No orders of the defined types  were placed in this encounter.  No orders of the defined types were placed in this encounter.   There are no Patient Instructions on file for this visit.   Signed, Armanii Pressnell, MD  03/14/2024 3:15 PM    [facility]     [1]  No outpatient medications have been marked as taking for the 03/17/24 encounter (Appointment) with Roseline Ebarb M, MD.   "

## 2024-03-15 ENCOUNTER — Encounter: Payer: Self-pay | Admitting: Sports Medicine

## 2024-03-17 ENCOUNTER — Ambulatory Visit: Admitting: Cardiology
